# Patient Record
Sex: Male | Born: 1985 | Race: White | Hispanic: No | Marital: Single | Smoking: Current every day smoker
Health system: Southern US, Community
[De-identification: ages and names within clinical notes are randomized; demographics above are authoritative.]

---
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
## 2014-04-05 ENCOUNTER — Emergency Department (HOSPITAL_COMMUNITY)
Admission: EM | Admit: 2014-04-05 | Discharge: 2014-04-05 | Disposition: A | Discharge status: Home / Self Care | Payer: No Typology Code available for payment source | Attending: Emergency Medicine | Admitting: Emergency Medicine

## 2014-04-05 ENCOUNTER — Emergency Department (HOSPITAL_COMMUNITY): Payer: No Typology Code available for payment source

## 2014-04-05 ENCOUNTER — Encounter (HOSPITAL_COMMUNITY): Payer: Self-pay | Admitting: Emergency Medicine

## 2014-04-05 DIAGNOSIS — S301XXA Contusion of abdominal wall, initial encounter: Secondary | ICD-10-CM | POA: Insufficient documentation

## 2014-04-05 DIAGNOSIS — S01512A Laceration without foreign body of oral cavity, initial encounter: Secondary | ICD-10-CM

## 2014-04-05 DIAGNOSIS — S0080XA Unspecified superficial injury of other part of head, initial encounter: Secondary | ICD-10-CM

## 2014-04-05 DIAGNOSIS — Z23 Encounter for immunization: Secondary | ICD-10-CM | POA: Insufficient documentation

## 2014-04-05 DIAGNOSIS — S0000XA Unspecified superficial injury of scalp, initial encounter: Secondary | ICD-10-CM | POA: Insufficient documentation

## 2014-04-05 DIAGNOSIS — R4182 Altered mental status, unspecified: Secondary | ICD-10-CM | POA: Insufficient documentation

## 2014-04-05 DIAGNOSIS — S70929A Unspecified superficial injury of unspecified thigh, initial encounter: Secondary | ICD-10-CM

## 2014-04-05 DIAGNOSIS — S50919A Unspecified superficial injury of unspecified forearm, initial encounter: Secondary | ICD-10-CM

## 2014-04-05 DIAGNOSIS — S1090XA Unspecified superficial injury of unspecified part of neck, initial encounter: Secondary | ICD-10-CM

## 2014-04-05 DIAGNOSIS — S298XXA Other specified injuries of thorax, initial encounter: Secondary | ICD-10-CM | POA: Insufficient documentation

## 2014-04-05 DIAGNOSIS — S90919A Unspecified superficial injury of unspecified ankle, initial encounter: Secondary | ICD-10-CM

## 2014-04-05 DIAGNOSIS — S60919A Unspecified superficial injury of unspecified wrist, initial encounter: Secondary | ICD-10-CM

## 2014-04-05 DIAGNOSIS — S70919A Unspecified superficial injury of unspecified hip, initial encounter: Secondary | ICD-10-CM | POA: Insufficient documentation

## 2014-04-05 DIAGNOSIS — S01502A Unspecified open wound of oral cavity, initial encounter: Secondary | ICD-10-CM | POA: Insufficient documentation

## 2014-04-05 DIAGNOSIS — F17200 Nicotine dependence, unspecified, uncomplicated: Secondary | ICD-10-CM | POA: Insufficient documentation

## 2014-04-05 DIAGNOSIS — Y92410 Unspecified street and highway as the place of occurrence of the external cause: Secondary | ICD-10-CM | POA: Insufficient documentation

## 2014-04-05 DIAGNOSIS — S80929A Unspecified superficial injury of unspecified lower leg, initial encounter: Secondary | ICD-10-CM | POA: Insufficient documentation

## 2014-04-05 DIAGNOSIS — S50909A Unspecified superficial injury of unspecified elbow, initial encounter: Secondary | ICD-10-CM | POA: Insufficient documentation

## 2014-04-05 DIAGNOSIS — Y9389 Activity, other specified: Secondary | ICD-10-CM | POA: Insufficient documentation

## 2014-04-05 DIAGNOSIS — T07XXXA Unspecified multiple injuries, initial encounter: Secondary | ICD-10-CM

## 2014-04-05 LAB — CBC WITH DIFFERENTIAL/PLATELET
BASOS PCT: 0 % (ref 0–1)
Basophils Absolute: 0 10*3/uL (ref 0.0–0.1)
Eosinophils Absolute: 0.1 10*3/uL (ref 0.0–0.7)
Eosinophils Relative: 0 % (ref 0–5)
HCT: 47.3 % (ref 39.0–52.0)
HEMOGLOBIN: 16.7 g/dL (ref 13.0–17.0)
Lymphocytes Relative: 8 % — ABNORMAL LOW (ref 12–46)
Lymphs Abs: 1.1 10*3/uL (ref 0.7–4.0)
MCH: 32.2 pg (ref 26.0–34.0)
MCHC: 35.3 g/dL (ref 30.0–36.0)
MCV: 91.3 fL (ref 78.0–100.0)
MONO ABS: 0.9 10*3/uL (ref 0.1–1.0)
MONOS PCT: 6 % (ref 3–12)
NEUTROS ABS: 12.6 10*3/uL — AB (ref 1.7–7.7)
Neutrophils Relative %: 86 % — ABNORMAL HIGH (ref 43–77)
Platelets: 162 10*3/uL (ref 150–400)
RBC: 5.18 MIL/uL (ref 4.22–5.81)
RDW: 13.2 % (ref 11.5–15.5)
WBC: 14.7 10*3/uL — ABNORMAL HIGH (ref 4.0–10.5)

## 2014-04-05 LAB — BASIC METABOLIC PANEL
Anion gap: 12 (ref 5–15)
BUN: 16 mg/dL (ref 6–23)
CHLORIDE: 104 meq/L (ref 96–112)
CO2: 26 meq/L (ref 19–32)
CREATININE: 0.88 mg/dL (ref 0.50–1.35)
Calcium: 8.6 mg/dL (ref 8.4–10.5)
GFR calc non Af Amer: >90 mL/min (ref >90)
Glucose, Bld: 87 mg/dL (ref 70–99)
Potassium: 4.2 mEq/L (ref 3.7–5.3)
Sodium: 142 mEq/L (ref 137–147)

## 2014-04-05 LAB — ETHANOL: ALCOHOL ETHYL (B): 70 mg/dL — AB (ref 0–11)

## 2014-04-05 MED ORDER — TETANUS-DIPHTH-ACELL PERTUSSIS 5-2.5-18.5 LF-MCG/0.5 IM SUSP
0.5000 mL | Freq: Once | INTRAMUSCULAR | Status: AC
  Administered 2014-04-05: 0.5 mL via INTRAMUSCULAR
  Filled 2014-04-05: qty 0.5

## 2014-04-05 MED ORDER — SODIUM CHLORIDE 0.9 % IV SOLN
INTRAVENOUS | Status: DC
  Administered 2014-04-05: 14:00:00 via INTRAVENOUS

## 2014-04-05 MED ORDER — SODIUM CHLORIDE 0.9 % IV BOLUS (SEPSIS)
250.0000 mL | Freq: Once | INTRAVENOUS | Status: AC
  Administered 2014-04-05: 250 mL via INTRAVENOUS

## 2014-04-05 MED ORDER — ONDANSETRON HCL 4 MG/2ML IJ SOLN
4.0000 mg | Freq: Once | INTRAMUSCULAR | Status: AC
  Administered 2014-04-05: 4 mg via INTRAVENOUS
  Filled 2014-04-05: qty 2

## 2014-04-05 MED ORDER — HYDROMORPHONE HCL PF 1 MG/ML IJ SOLN
1.0000 mg | Freq: Once | INTRAMUSCULAR | Status: AC
  Administered 2014-04-05: 1 mg via INTRAVENOUS
  Filled 2014-04-05: qty 1

## 2014-04-05 MED ORDER — IOHEXOL 300 MG/ML  SOLN
100.0000 mL | Freq: Once | INTRAMUSCULAR | Status: AC | PRN
  Administered 2014-04-05: 100 mL via INTRAVENOUS

## 2014-04-05 MED ORDER — NAPROXEN 500 MG PO TABS: 500.0000 mg | ORAL_TABLET | Freq: Two times a day (BID) | ORAL | Status: AC

## 2014-04-05 MED ORDER — HYDROCODONE-ACETAMINOPHEN 5-325 MG PO TABS: 1.0000 | ORAL_TABLET | Freq: Four times a day (QID) | ORAL | Status: AC | PRN
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
## 2014-04-05 NOTE — Discharge Instructions (Signed)
Expect to be sore and stiff for the next few days. CAT scans of your head through the pelvis without any significant findings. Her tetanus was updated today. The tongue laceration with heel and sewn. Foods or acid or spicy will burn it more. Take medications as directed. Return for any new or worse symptoms.
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
## 2019-10-07 DEATH — deceased
